# Patient Record
Sex: Female | Born: 1935 | Race: White | Hispanic: No
Health system: Southern US, Community
[De-identification: ages and names within clinical notes are randomized; demographics above are authoritative.]

## PROBLEM LIST (undated history)

## (undated) DIAGNOSIS — C801 Malignant (primary) neoplasm, unspecified: Secondary | ICD-10-CM

---

## 2001-08-29 ENCOUNTER — Encounter: Payer: Self-pay | Admitting: Obstetrics and Gynecology

## 2001-08-29 ENCOUNTER — Ambulatory Visit (HOSPITAL_COMMUNITY): Admission: RE | Admit: 2001-08-29 | Discharge: 2001-08-29 | Payer: Self-pay | Admitting: Obstetrics and Gynecology

## 2002-10-13 ENCOUNTER — Ambulatory Visit (HOSPITAL_COMMUNITY): Admission: RE | Admit: 2002-10-13 | Discharge: 2002-10-13 | Payer: Self-pay | Admitting: Internal Medicine

## 2002-10-13 ENCOUNTER — Encounter: Payer: Self-pay | Admitting: Internal Medicine

## 2003-11-13 ENCOUNTER — Ambulatory Visit: Admission: RE | Admit: 2003-11-13 | Discharge: 2004-02-11 | Payer: Self-pay | Admitting: *Deleted

## 2003-11-21 ENCOUNTER — Ambulatory Visit (HOSPITAL_COMMUNITY): Admission: RE | Admit: 2003-11-21 | Discharge: 2003-11-21 | Payer: Self-pay | Admitting: Internal Medicine

## 2005-10-12 ENCOUNTER — Ambulatory Visit (HOSPITAL_COMMUNITY): Admission: RE | Admit: 2005-10-12 | Discharge: 2005-10-12 | Payer: Self-pay | Admitting: Ophthalmology

## 2006-01-29 ENCOUNTER — Ambulatory Visit (HOSPITAL_COMMUNITY): Admission: RE | Admit: 2006-01-29 | Discharge: 2006-01-29 | Payer: Self-pay | Admitting: Ophthalmology

## 2008-10-12 ENCOUNTER — Emergency Department (HOSPITAL_COMMUNITY): Admission: EM | Admit: 2008-10-12 | Discharge: 2008-10-12 | Payer: Self-pay | Admitting: Emergency Medicine

## 2010-10-24 NOTE — Op Note (Signed)
NAME:  Daisy Ray, Daisy Ray                         ACCOUNT NO.:  [id number]   MEDICAL RECORD NO.:  [id number]                   PATIENT TYPE:  AMB   LOCATION:  DAY                                  FACILITY:  [facility]   PHYSICIAN:  Dalia Heading, M.D.               DATE OF BIRTH:  22-Jan-1929   DATE OF PROCEDURE:  11/21/2003  DATE OF DISCHARGE:                                 OPERATIVE REPORT   PREOPERATIVE DIAGNOSIS:  Necrosis of surgical wound, left breast.   POSTOPERATIVE DIAGNOSIS:  Necrosis of surgical wound, left breast.   PROCEDURE:  Debridement of left breast wound.   SURGEON:  Dr. Franky Macho   ANESTHESIA:  General.   INDICATIONS:  The patient is a 77 year old white female, status post a left  partial mastectomy with __________ lymph node biopsy at another facility in  May of this year, who presents with necrosis and dehiscence of her left  breast surgical site.  The patient now comes to the operating room for  debridement of the wound.  The risks and benefits of the procedure,  including bleeding, infection, and poor wound healing were fully explained  to the patient, gave informed consent.   PROCEDURE NOTE:  The patient was placed in the supine position.  After  general anesthesia was administered, the left breast was prepped and draped  using the usual sterile technique with Betadine.  Surgical site confirmation  was performed.   A necrotic incision line in the upper, outer quadrant of the left breast was  excised using an elliptical incision.  The dissection was taken down to the  breast tissue itself.  No abscess was found.  A small seroma was seen.  Clear yellow fluid was noted to be in this area.  The wound was irrigated  with normal saline.  The skin was then reapproximated using 3-0 nylon  interrupted sutures.  Betadine ointment and a dry sterile dressing were  applied.   All tape and needle counts were correct at the end of the procedure.  The  patient was  awakened and transferred to PACU in stable condition.   COMPLICATIONS:  None.   SPECIMENS:  Skin, left breast.   BLOOD LOSS:  Minimal.      ___________________________________________                                            Dalia Heading, M.D.   MAJ/MEDQ  D:  11/21/2003  T:  11/21/2003  Job:  [id number]   cc:   Erskine Speed, M.D.  [address], [place]  [place]  [place] [address]  Fax: [phone]   Elmer Sow. Dorna Bloom, M.D.  [address] - [facility]  [place]  [place] [address]  Fax: [phone]   Lynett Fish, M.D.  [phone]  Sissy [address] [place]  [place] [address]  Fax: [phone]   Almetta Lovely  [address]  [place]  [place] [address]  Fax: [phone]

## 2010-10-24 NOTE — H&P (Signed)
NAME:  Daisy Ray, Daisy Ray                         ACCOUNT NO.:  [id number]   MEDICAL RECORD NO.:  [id number]                   PATIENT TYPE:  AMB   LOCATION:  DAY                                  FACILITY:  [facility]   PHYSICIAN:  Dalia Heading, M.D.               DATE OF BIRTH:  1928-10-25   DATE OF ADMISSION:  DATE OF DISCHARGE:                                HISTORY & PHYSICAL   CHIEF COMPLAINT:  Poorly healing surgical wound, left breast.   HISTORY OF PRESENT ILLNESS:  The patient is a 77 year old white female who  was referred for evaluation and treatment of a poorly healing left breast  wound.  She underwent a left partial mastectomy with axillary lymph node  biopsy on Oct 16, 2003 by another physician. She states that she was cleared  of cancer and has seen Dr. Cleone Slim and Dr. Dorna Bloom of the cancer center in [place].  She has developed a wound infection which was initially treated with  Augmentin.  The wound has now become necrotic despite wound care at home.  She would like treatment here due to the proximity to work.   PAST MEDICAL HISTORY:  1. As noted above.  2. High cholesterol levels.   PAST SURGICAL HISTORY:  As noted above.   CURRENT MEDICATIONS:  1. Augmentin.  2. Arimidex.  3. Lipitor.   ALLERGIES:  No known drug allergies.   SOCIAL HISTORY:  The patient denies drinking or smoking.   REVIEW OF SYMPTOMS:  CARDIOPULMONARY:  She denies any cardiopulmonary  difficulties. HEMATOLOGIC:  She denies any bleeding disorders.   PHYSICAL EXAMINATION:  GENERAL:  The patient is a well developed, well  nourished white female who is anxious and in no acute distress.  VITAL SIGNS:  She is afebrile and her vital signs are stable.  LUNGS:  Clear to auscultation with equal breath sounds bilaterally.  HEART:  Regular rate and rhythm without S3, S4 or murmurs.  BREASTS:  The left breast examination reveals a surgical scar in the upper,  outer quadrant with necrotic edges and a partial  dehiscence of the skin.  Mild erythema is noted circumferentially.   IMPRESSION:  Poorly healing surgical wound, left breast.   PLAN:  The patient is scheduled for debridement of the left breast wound on  November 21, 2003.  The risks and benefits of the procedure including bleeding,  infection and a poorly healing wound were fully explained to the patient who  gave informed consent.     ___________________________________________                                         Dalia Heading, M.D.   MAJ/MEDQ  D:  11/20/2003  T:  11/20/2003  Job:  [id number]   cc:   Lynett Fish, M.D.  [address] [place]  [place] [address]  Fax: [phone]   Elmer Sow. Dorna Bloom, M.D.  [address] - [facility]  [place]  [place] [address]  Fax: [phone]   Erskine Speed, M.D.  [address], [place] [place]  [place]  [place] [address]  Fax: [phone]   Almetta Lovely  [address]  [place]  [place] [address]  Fax: [phone]

## 2010-11-07 ENCOUNTER — Other Ambulatory Visit (HOSPITAL_COMMUNITY): Payer: Self-pay | Admitting: Orthopaedic Surgery

## 2010-11-07 DIAGNOSIS — R52 Pain, unspecified: Secondary | ICD-10-CM

## 2010-11-10 ENCOUNTER — Ambulatory Visit (HOSPITAL_COMMUNITY)
Admission: RE | Admit: 2010-11-10 | Discharge: 2010-11-10 | Disposition: A | Discharge status: Home / Self Care | Payer: Medicare Other | Source: Ambulatory Visit | Attending: Orthopaedic Surgery | Admitting: Orthopaedic Surgery

## 2010-11-10 DIAGNOSIS — M1710 Unilateral primary osteoarthritis, unspecified knee: Secondary | ICD-10-CM | POA: Insufficient documentation

## 2010-11-10 DIAGNOSIS — IMO0002 Reserved for concepts with insufficient information to code with codable children: Secondary | ICD-10-CM | POA: Insufficient documentation

## 2010-11-10 DIAGNOSIS — M25569 Pain in unspecified knee: Secondary | ICD-10-CM | POA: Insufficient documentation

## 2010-11-10 DIAGNOSIS — R52 Pain, unspecified: Secondary | ICD-10-CM

## 2011-11-13 ENCOUNTER — Encounter: Payer: Medicare Other | Admitting: Hematology and Oncology

## 2011-11-13 ENCOUNTER — Other Ambulatory Visit: Payer: Self-pay | Admitting: Hematology and Oncology

## 2011-11-13 DIAGNOSIS — C50919 Malignant neoplasm of unspecified site of unspecified female breast: Secondary | ICD-10-CM

## 2011-11-13 DIAGNOSIS — C8589 Other specified types of non-Hodgkin lymphoma, extranodal and solid organ sites: Secondary | ICD-10-CM

## 2011-11-13 DIAGNOSIS — C8590 Non-Hodgkin lymphoma, unspecified, unspecified site: Secondary | ICD-10-CM

## 2011-11-19 ENCOUNTER — Encounter (HOSPITAL_COMMUNITY)
Admission: RE | Admit: 2011-11-19 | Discharge: 2011-11-19 | Disposition: A | Discharge status: Home / Self Care | Payer: Medicare Other | Source: Ambulatory Visit | Attending: Hematology and Oncology | Admitting: Hematology and Oncology

## 2011-11-19 DIAGNOSIS — C8589 Other specified types of non-Hodgkin lymphoma, extranodal and solid organ sites: Secondary | ICD-10-CM | POA: Insufficient documentation

## 2011-11-19 DIAGNOSIS — C50919 Malignant neoplasm of unspecified site of unspecified female breast: Secondary | ICD-10-CM | POA: Insufficient documentation

## 2011-11-19 DIAGNOSIS — I2510 Atherosclerotic heart disease of native coronary artery without angina pectoris: Secondary | ICD-10-CM | POA: Insufficient documentation

## 2011-11-19 DIAGNOSIS — C8590 Non-Hodgkin lymphoma, unspecified, unspecified site: Secondary | ICD-10-CM

## 2011-11-19 DIAGNOSIS — E049 Nontoxic goiter, unspecified: Secondary | ICD-10-CM | POA: Insufficient documentation

## 2011-11-19 LAB — GLUCOSE, CAPILLARY: Glucose-Capillary: 106 mg/dL — ABNORMAL HIGH (ref 70–99)

## 2011-11-19 MED ORDER — FLUDEOXYGLUCOSE F - 18 (FDG) INJECTION
16.0000 | Freq: Once | INTRAVENOUS | Status: AC | PRN
  Administered 2011-11-19: 16 via INTRAVENOUS

## 2011-11-25 ENCOUNTER — Encounter: Payer: Medicare Other | Admitting: Hematology and Oncology

## 2011-11-25 DIAGNOSIS — E785 Hyperlipidemia, unspecified: Secondary | ICD-10-CM

## 2011-11-25 DIAGNOSIS — C8299 Follicular lymphoma, unspecified, extranodal and solid organ sites: Secondary | ICD-10-CM

## 2011-11-25 DIAGNOSIS — C50919 Malignant neoplasm of unspecified site of unspecified female breast: Secondary | ICD-10-CM

## 2011-11-25 DIAGNOSIS — E039 Hypothyroidism, unspecified: Secondary | ICD-10-CM

## 2011-12-29 ENCOUNTER — Encounter: Payer: Medicare Other | Admitting: Hematology and Oncology

## 2011-12-29 DIAGNOSIS — C8589 Other specified types of non-Hodgkin lymphoma, extranodal and solid organ sites: Secondary | ICD-10-CM

## 2011-12-29 DIAGNOSIS — N3289 Other specified disorders of bladder: Secondary | ICD-10-CM

## 2011-12-29 DIAGNOSIS — C50919 Malignant neoplasm of unspecified site of unspecified female breast: Secondary | ICD-10-CM

## 2012-02-02 ENCOUNTER — Encounter: Payer: Medicare Other | Admitting: Hematology and Oncology

## 2012-02-02 DIAGNOSIS — C8589 Other specified types of non-Hodgkin lymphoma, extranodal and solid organ sites: Secondary | ICD-10-CM

## 2012-02-02 DIAGNOSIS — C50919 Malignant neoplasm of unspecified site of unspecified female breast: Secondary | ICD-10-CM

## 2012-02-29 ENCOUNTER — Encounter: Payer: Medicare Other | Admitting: Hematology and Oncology

## 2012-02-29 DIAGNOSIS — C50919 Malignant neoplasm of unspecified site of unspecified female breast: Secondary | ICD-10-CM

## 2012-02-29 DIAGNOSIS — C8291 Follicular lymphoma, unspecified, lymph nodes of head, face, and neck: Secondary | ICD-10-CM

## 2012-03-18 DIAGNOSIS — C8299 Follicular lymphoma, unspecified, extranodal and solid organ sites: Secondary | ICD-10-CM

## 2012-03-18 DIAGNOSIS — E039 Hypothyroidism, unspecified: Secondary | ICD-10-CM

## 2012-03-18 DIAGNOSIS — C50919 Malignant neoplasm of unspecified site of unspecified female breast: Secondary | ICD-10-CM

## 2012-03-18 DIAGNOSIS — N3289 Other specified disorders of bladder: Secondary | ICD-10-CM

## 2012-03-25 DIAGNOSIS — N8111 Cystocele, midline: Secondary | ICD-10-CM

## 2012-03-25 DIAGNOSIS — E785 Hyperlipidemia, unspecified: Secondary | ICD-10-CM

## 2012-03-25 DIAGNOSIS — C50919 Malignant neoplasm of unspecified site of unspecified female breast: Secondary | ICD-10-CM

## 2012-03-25 DIAGNOSIS — C8291 Follicular lymphoma, unspecified, lymph nodes of head, face, and neck: Secondary | ICD-10-CM

## 2012-05-17 ENCOUNTER — Encounter: Payer: Medicare Other | Admitting: Internal Medicine

## 2012-05-17 DIAGNOSIS — C8589 Other specified types of non-Hodgkin lymphoma, extranodal and solid organ sites: Secondary | ICD-10-CM

## 2012-05-17 DIAGNOSIS — R222 Localized swelling, mass and lump, trunk: Secondary | ICD-10-CM

## 2012-05-17 DIAGNOSIS — R05 Cough: Secondary | ICD-10-CM

## 2012-05-17 DIAGNOSIS — C50919 Malignant neoplasm of unspecified site of unspecified female breast: Secondary | ICD-10-CM

## 2012-05-26 DIAGNOSIS — C50919 Malignant neoplasm of unspecified site of unspecified female breast: Secondary | ICD-10-CM

## 2012-05-26 DIAGNOSIS — C8589 Other specified types of non-Hodgkin lymphoma, extranodal and solid organ sites: Secondary | ICD-10-CM

## 2012-05-27 ENCOUNTER — Other Ambulatory Visit: Payer: Self-pay | Admitting: Hematology and Oncology

## 2012-05-27 DIAGNOSIS — C8590 Non-Hodgkin lymphoma, unspecified, unspecified site: Secondary | ICD-10-CM

## 2012-06-07 ENCOUNTER — Other Ambulatory Visit: Payer: Self-pay | Admitting: Radiology

## 2012-06-07 DIAGNOSIS — K1230 Oral mucositis (ulcerative), unspecified: Secondary | ICD-10-CM

## 2012-06-07 DIAGNOSIS — K121 Other forms of stomatitis: Secondary | ICD-10-CM

## 2012-06-07 DIAGNOSIS — C8299 Follicular lymphoma, unspecified, extranodal and solid organ sites: Secondary | ICD-10-CM

## 2012-06-07 DIAGNOSIS — R609 Edema, unspecified: Secondary | ICD-10-CM

## 2012-06-10 ENCOUNTER — Ambulatory Visit (HOSPITAL_COMMUNITY)
Admission: RE | Admit: 2012-06-10 | Discharge: 2012-06-10 | Disposition: A | Discharge status: Home / Self Care | Payer: Medicare Other | Source: Ambulatory Visit | Attending: Hematology and Oncology | Admitting: Hematology and Oncology

## 2012-06-10 ENCOUNTER — Other Ambulatory Visit: Payer: Self-pay | Admitting: Hematology and Oncology

## 2012-06-10 DIAGNOSIS — C8590 Non-Hodgkin lymphoma, unspecified, unspecified site: Secondary | ICD-10-CM

## 2012-06-10 DIAGNOSIS — C8589 Other specified types of non-Hodgkin lymphoma, extranodal and solid organ sites: Secondary | ICD-10-CM | POA: Insufficient documentation

## 2012-06-10 LAB — CBC
Hemoglobin: 10.8 g/dL — ABNORMAL LOW (ref 12.0–15.0)
MCH: 28 pg (ref 26.0–34.0)
MCV: 82.9 fL (ref 78.0–100.0)
RBC: 3.86 MIL/uL — ABNORMAL LOW (ref 3.87–5.11)

## 2012-06-10 MED ORDER — HEPARIN SOD (PORK) LOCK FLUSH 100 UNIT/ML IV SOLN
500.0000 [IU] | Freq: Once | INTRAVENOUS | Status: AC
  Administered 2012-06-10: 500 [IU] via INTRAVENOUS

## 2012-06-10 MED ORDER — LIDOCAINE HCL 1 % IJ SOLN
INTRAMUSCULAR | Status: AC
  Filled 2012-06-10: qty 20

## 2012-06-10 MED ORDER — MIDAZOLAM HCL 2 MG/2ML IJ SOLN
INTRAMUSCULAR | Status: AC | PRN
  Administered 2012-06-10: 1 mg via INTRAVENOUS
  Administered 2012-06-10: 3 mg via INTRAVENOUS

## 2012-06-10 MED ORDER — CEFAZOLIN SODIUM 1-5 GM-% IV SOLN
1.0000 g | INTRAVENOUS | Status: AC
  Administered 2012-06-10: 1 g via INTRAVENOUS
  Filled 2012-06-10: qty 50

## 2012-06-10 MED ORDER — SODIUM CHLORIDE 0.9 % IV SOLN: INTRAVENOUS | Status: DC

## 2012-06-10 MED ORDER — FENTANYL CITRATE 0.05 MG/ML IJ SOLN
INTRAMUSCULAR | Status: AC
  Filled 2012-06-10: qty 4

## 2012-06-10 MED ORDER — FENTANYL CITRATE 0.05 MG/ML IJ SOLN
INTRAMUSCULAR | Status: AC | PRN
  Administered 2012-06-10: 100 ug via INTRAVENOUS

## 2012-06-10 MED ORDER — MIDAZOLAM HCL 2 MG/2ML IJ SOLN
INTRAMUSCULAR | Status: AC
  Filled 2012-06-10: qty 4

## 2012-06-10 NOTE — H&P (Signed)
Agree 

## 2012-06-10 NOTE — H&P (Signed)
Daisy Ray is an 79 y.o. female.   Chief Complaint: "I'm here for a port a cath" HPI: Patient with history of NHL presents today for port a cath placement for chemotherapy.  PMH: left breast cancer 2005, HTN, elevated cholesterol,  PSH: left partial mastectomy, bladder tack, hysterectomy FH: daughter with hx lymphoma                                                                                                          Social History:  does not have a smoking history on file. She does not have any smokeless tobacco history on file. Her alcohol and drug histories not on file.  Allergies: No Known Allergies  No current outpatient prescriptions on file. Current facility-administered medications:0.9 %  sodium chloride infusion, , Intravenous, Continuous, D Kevin Jeanet Lupe, PA;  ceFAZolin (ANCEF) IVPB 1 g/50 mL premix, 1 g, Intravenous, On Call, D Jeananne Rama, PA  Results for orders placed during the hospital encounter of 06/10/12  APTT      Component Value Range   aPTT 33  24 - 37 seconds  CBC      Component Value Range   WBC 7.8  4.0 - 10.5 K/uL   RBC 3.86 (*) 3.87 - 5.11 MIL/uL   Hemoglobin 10.8 (*) 12.0 - 15.0 g/dL   HCT 16.1 (*) 09.6 - 04.5 %   MCV 82.9  78.0 - 100.0 fL   MCH 28.0  26.0 - 34.0 pg   MCHC 33.8  30.0 - 36.0 g/dL   RDW 40.9  81.1 - 91.4 %   Platelets 235  150 - 400 K/uL  PROTIME-INR      Component Value Range   Prothrombin Time 13.2  11.6 - 15.2 seconds   INR 1.01  0.00 - 1.49    No results found.  Review of Systems  Constitutional: Negative for fever and chills.  Respiratory: Positive for shortness of breath. Negative for cough.   Cardiovascular: Negative for chest pain.  Gastrointestinal: Negative for nausea, vomiting and abdominal pain.  Musculoskeletal: Negative for back pain.       Bilateral leg swelling  Neurological: Negative for headaches.  Endo/Heme/Allergies: Does not bruise/bleed easily.    Blood pressure 135/55, pulse 68, temperature 97.6 F  (36.4 C), temperature source Oral, resp. rate 20, SpO2 95.00%. Physical Exam  Constitutional: She is oriented to person, place, and time. She appears well-developed and well-nourished.  Cardiovascular: Normal rate and regular rhythm.   Respiratory: Effort normal.       Few left basilar crackles  GI: Soft. Bowel sounds are normal. There is no tenderness.  Musculoskeletal: Normal range of motion. She exhibits edema.  Neurological: She is alert and oriented to person, place, and time.     Assessment/Plan Pt with NHL. Plan is for port a cath placement today for chemotherapy. Details/risks of procedure d/w pt/family with their understanding and consent.  Breven Guidroz,D KEVIN 06/10/2012, 12:57 PM

## 2012-06-10 NOTE — Procedures (Signed)
Procedure:  Right IJ portacath Access:  Right IJ vein Findings:  Cath tip at cavoatrial junction.  OK to use.

## 2012-06-13 DIAGNOSIS — C8589 Other specified types of non-Hodgkin lymphoma, extranodal and solid organ sites: Secondary | ICD-10-CM

## 2012-06-13 DIAGNOSIS — C50919 Malignant neoplasm of unspecified site of unspecified female breast: Secondary | ICD-10-CM

## 2012-06-13 DIAGNOSIS — Z5111 Encounter for antineoplastic chemotherapy: Secondary | ICD-10-CM

## 2012-06-14 DIAGNOSIS — C8589 Other specified types of non-Hodgkin lymphoma, extranodal and solid organ sites: Secondary | ICD-10-CM

## 2012-06-14 DIAGNOSIS — Z5111 Encounter for antineoplastic chemotherapy: Secondary | ICD-10-CM

## 2012-06-14 DIAGNOSIS — Z5112 Encounter for antineoplastic immunotherapy: Secondary | ICD-10-CM

## 2012-06-15 DIAGNOSIS — T451X5A Adverse effect of antineoplastic and immunosuppressive drugs, initial encounter: Secondary | ICD-10-CM

## 2012-06-15 DIAGNOSIS — C8589 Other specified types of non-Hodgkin lymphoma, extranodal and solid organ sites: Secondary | ICD-10-CM

## 2012-06-15 DIAGNOSIS — D702 Other drug-induced agranulocytosis: Secondary | ICD-10-CM

## 2012-06-21 DIAGNOSIS — C8299 Follicular lymphoma, unspecified, extranodal and solid organ sites: Secondary | ICD-10-CM

## 2012-06-21 DIAGNOSIS — D72829 Elevated white blood cell count, unspecified: Secondary | ICD-10-CM

## 2012-07-11 DIAGNOSIS — C8589 Other specified types of non-Hodgkin lymphoma, extranodal and solid organ sites: Secondary | ICD-10-CM

## 2012-07-11 DIAGNOSIS — Z5111 Encounter for antineoplastic chemotherapy: Secondary | ICD-10-CM

## 2012-07-11 DIAGNOSIS — Z5112 Encounter for antineoplastic immunotherapy: Secondary | ICD-10-CM

## 2012-07-12 DIAGNOSIS — Z5111 Encounter for antineoplastic chemotherapy: Secondary | ICD-10-CM

## 2012-07-12 DIAGNOSIS — C8589 Other specified types of non-Hodgkin lymphoma, extranodal and solid organ sites: Secondary | ICD-10-CM

## 2012-07-13 DIAGNOSIS — D702 Other drug-induced agranulocytosis: Secondary | ICD-10-CM

## 2012-07-13 DIAGNOSIS — T451X5A Adverse effect of antineoplastic and immunosuppressive drugs, initial encounter: Secondary | ICD-10-CM

## 2012-07-13 DIAGNOSIS — C8589 Other specified types of non-Hodgkin lymphoma, extranodal and solid organ sites: Secondary | ICD-10-CM

## 2012-08-08 DIAGNOSIS — D649 Anemia, unspecified: Secondary | ICD-10-CM

## 2012-08-08 DIAGNOSIS — C8589 Other specified types of non-Hodgkin lymphoma, extranodal and solid organ sites: Secondary | ICD-10-CM

## 2012-08-08 DIAGNOSIS — Z5111 Encounter for antineoplastic chemotherapy: Secondary | ICD-10-CM

## 2012-08-09 DIAGNOSIS — C8299 Follicular lymphoma, unspecified, extranodal and solid organ sites: Secondary | ICD-10-CM

## 2012-08-09 DIAGNOSIS — Z5111 Encounter for antineoplastic chemotherapy: Secondary | ICD-10-CM

## 2012-08-09 DIAGNOSIS — C50919 Malignant neoplasm of unspecified site of unspecified female breast: Secondary | ICD-10-CM

## 2012-08-10 DIAGNOSIS — C50919 Malignant neoplasm of unspecified site of unspecified female breast: Secondary | ICD-10-CM

## 2012-08-10 DIAGNOSIS — D702 Other drug-induced agranulocytosis: Secondary | ICD-10-CM

## 2012-08-10 DIAGNOSIS — C8299 Follicular lymphoma, unspecified, extranodal and solid organ sites: Secondary | ICD-10-CM

## 2012-08-15 DIAGNOSIS — C8292 Follicular lymphoma, unspecified, intrathoracic lymph nodes: Secondary | ICD-10-CM

## 2012-09-02 DIAGNOSIS — C8589 Other specified types of non-Hodgkin lymphoma, extranodal and solid organ sites: Secondary | ICD-10-CM

## 2012-09-13 DIAGNOSIS — C8589 Other specified types of non-Hodgkin lymphoma, extranodal and solid organ sites: Secondary | ICD-10-CM

## 2012-10-28 DIAGNOSIS — C8589 Other specified types of non-Hodgkin lymphoma, extranodal and solid organ sites: Secondary | ICD-10-CM

## 2012-10-28 DIAGNOSIS — R918 Other nonspecific abnormal finding of lung field: Secondary | ICD-10-CM

## 2012-11-24 DIAGNOSIS — C8589 Other specified types of non-Hodgkin lymphoma, extranodal and solid organ sites: Secondary | ICD-10-CM

## 2012-11-24 DIAGNOSIS — E785 Hyperlipidemia, unspecified: Secondary | ICD-10-CM

## 2012-11-24 DIAGNOSIS — E0590 Thyrotoxicosis, unspecified without thyrotoxic crisis or storm: Secondary | ICD-10-CM

## 2012-12-22 DIAGNOSIS — R918 Other nonspecific abnormal finding of lung field: Secondary | ICD-10-CM

## 2012-12-22 DIAGNOSIS — C8299 Follicular lymphoma, unspecified, extranodal and solid organ sites: Secondary | ICD-10-CM

## 2013-02-02 DIAGNOSIS — C8589 Other specified types of non-Hodgkin lymphoma, extranodal and solid organ sites: Secondary | ICD-10-CM

## 2013-02-02 DIAGNOSIS — Z452 Encounter for adjustment and management of vascular access device: Secondary | ICD-10-CM

## 2013-02-02 DIAGNOSIS — C50919 Malignant neoplasm of unspecified site of unspecified female breast: Secondary | ICD-10-CM

## 2013-03-21 DIAGNOSIS — C50919 Malignant neoplasm of unspecified site of unspecified female breast: Secondary | ICD-10-CM

## 2013-03-21 DIAGNOSIS — C8589 Other specified types of non-Hodgkin lymphoma, extranodal and solid organ sites: Secondary | ICD-10-CM

## 2013-03-21 DIAGNOSIS — Z452 Encounter for adjustment and management of vascular access device: Secondary | ICD-10-CM

## 2013-04-04 ENCOUNTER — Other Ambulatory Visit (HOSPITAL_COMMUNITY): Payer: Self-pay | Admitting: Hematology and Oncology

## 2013-04-04 DIAGNOSIS — R911 Solitary pulmonary nodule: Secondary | ICD-10-CM

## 2013-04-04 DIAGNOSIS — C8589 Other specified types of non-Hodgkin lymphoma, extranodal and solid organ sites: Secondary | ICD-10-CM

## 2013-04-04 DIAGNOSIS — C8590 Non-Hodgkin lymphoma, unspecified, unspecified site: Secondary | ICD-10-CM

## 2013-04-13 ENCOUNTER — Encounter (HOSPITAL_COMMUNITY)
Admission: RE | Admit: 2013-04-13 | Discharge: 2013-04-13 | Disposition: A | Discharge status: Home / Self Care | Payer: Medicare Other | Source: Ambulatory Visit | Attending: Hematology and Oncology | Admitting: Hematology and Oncology

## 2013-04-13 DIAGNOSIS — C8589 Other specified types of non-Hodgkin lymphoma, extranodal and solid organ sites: Secondary | ICD-10-CM | POA: Insufficient documentation

## 2013-04-13 DIAGNOSIS — R911 Solitary pulmonary nodule: Secondary | ICD-10-CM

## 2013-04-13 DIAGNOSIS — C8590 Non-Hodgkin lymphoma, unspecified, unspecified site: Secondary | ICD-10-CM

## 2013-04-13 MED ORDER — FLUDEOXYGLUCOSE F - 18 (FDG) INJECTION
17.0000 | Freq: Once | INTRAVENOUS | Status: AC | PRN
  Administered 2013-04-13: 17 via INTRAVENOUS

## 2013-04-17 DIAGNOSIS — R911 Solitary pulmonary nodule: Secondary | ICD-10-CM

## 2013-04-17 DIAGNOSIS — Z853 Personal history of malignant neoplasm of breast: Secondary | ICD-10-CM

## 2013-04-17 DIAGNOSIS — C8589 Other specified types of non-Hodgkin lymphoma, extranodal and solid organ sites: Secondary | ICD-10-CM

## 2013-05-16 DIAGNOSIS — C50919 Malignant neoplasm of unspecified site of unspecified female breast: Secondary | ICD-10-CM

## 2013-05-16 DIAGNOSIS — Z452 Encounter for adjustment and management of vascular access device: Secondary | ICD-10-CM

## 2013-05-16 DIAGNOSIS — C8589 Other specified types of non-Hodgkin lymphoma, extranodal and solid organ sites: Secondary | ICD-10-CM

## 2014-04-02 ENCOUNTER — Other Ambulatory Visit (INDEPENDENT_AMBULATORY_CARE_PROVIDER_SITE_OTHER): Payer: Self-pay | Admitting: *Deleted

## 2014-04-02 ENCOUNTER — Telehealth (INDEPENDENT_AMBULATORY_CARE_PROVIDER_SITE_OTHER): Payer: Self-pay | Admitting: *Deleted

## 2014-04-02 ENCOUNTER — Encounter (HOSPITAL_COMMUNITY): Payer: Self-pay | Admitting: Pharmacy Technician

## 2014-04-02 DIAGNOSIS — R935 Abnormal findings on diagnostic imaging of other abdominal regions, including retroperitoneum: Secondary | ICD-10-CM

## 2014-04-02 NOTE — Telephone Encounter (Signed)
Referring MD/PCP: neijstrom   Procedure: egd  Reason/Indication:  Abnormal CT  Has patient had this procedure before?  no  If so, when, by whom and where?    Is there a family history of colon cancer?    Who?  What age when diagnosed?    Is patient diabetic?   no      Does patient have prosthetic heart valve?  no  Do you have a pacemaker?  no  Has patient ever had endocarditis? no  Has patient had joint replacement within last 12 months?  no  Does patient tend to be constipated or take laxatives? no  Is patient on Coumadin, Plavix and/or Aspirin? no  Medications: see epic  Allergies: nkda  Medication Adjustment:   Procedure date & time: 04/06/14 at 215

## 2014-04-05 NOTE — Telephone Encounter (Signed)
agree

## 2014-04-06 ENCOUNTER — Encounter (HOSPITAL_COMMUNITY): Payer: Self-pay | Admitting: *Deleted

## 2014-04-06 ENCOUNTER — Encounter (HOSPITAL_COMMUNITY)
Admission: RE | Disposition: A | Discharge status: Home / Self Care | Payer: Self-pay | Source: Ambulatory Visit | Attending: Internal Medicine

## 2014-04-06 ENCOUNTER — Ambulatory Visit (HOSPITAL_COMMUNITY)
Admission: RE | Admit: 2014-04-06 | Discharge: 2014-04-06 | Disposition: A | Discharge status: Home / Self Care | Payer: Medicare Other | Source: Ambulatory Visit | Attending: Internal Medicine | Admitting: Internal Medicine

## 2014-04-06 DIAGNOSIS — R1909 Other intra-abdominal and pelvic swelling, mass and lump: Secondary | ICD-10-CM | POA: Diagnosis present

## 2014-04-06 DIAGNOSIS — Z791 Long term (current) use of non-steroidal anti-inflammatories (NSAID): Secondary | ICD-10-CM

## 2014-04-06 DIAGNOSIS — Z8572 Personal history of non-Hodgkin lymphomas: Secondary | ICD-10-CM | POA: Insufficient documentation

## 2014-04-06 DIAGNOSIS — R933 Abnormal findings on diagnostic imaging of other parts of digestive tract: Secondary | ICD-10-CM

## 2014-04-06 DIAGNOSIS — K449 Diaphragmatic hernia without obstruction or gangrene: Secondary | ICD-10-CM | POA: Diagnosis not present

## 2014-04-06 DIAGNOSIS — Q2733 Arteriovenous malformation of digestive system vessel: Secondary | ICD-10-CM | POA: Diagnosis not present

## 2014-04-06 DIAGNOSIS — C8590 Non-Hodgkin lymphoma, unspecified, unspecified site: Secondary | ICD-10-CM

## 2014-04-06 DIAGNOSIS — K2900 Acute gastritis without bleeding: Secondary | ICD-10-CM | POA: Insufficient documentation

## 2014-04-06 DIAGNOSIS — R935 Abnormal findings on diagnostic imaging of other abdominal regions, including retroperitoneum: Secondary | ICD-10-CM

## 2014-04-06 DIAGNOSIS — Z79899 Other long term (current) drug therapy: Secondary | ICD-10-CM | POA: Insufficient documentation

## 2014-04-06 HISTORY — DX: Malignant (primary) neoplasm, unspecified: C80.1

## 2014-04-06 HISTORY — PX: ESOPHAGOGASTRODUODENOSCOPY: SHX5428

## 2014-04-06 SURGERY — EGD (ESOPHAGOGASTRODUODENOSCOPY)
Anesthesia: Moderate Sedation

## 2014-04-06 MED ORDER — MEPERIDINE HCL 50 MG/ML IJ SOLN
INTRAMUSCULAR | Status: DC | PRN
  Administered 2014-04-06: 25 mg via INTRAVENOUS

## 2014-04-06 MED ORDER — STERILE WATER FOR IRRIGATION IR SOLN
Status: DC | PRN
  Administered 2014-04-06: 15:00:00

## 2014-04-06 MED ORDER — SODIUM CHLORIDE 0.9 % IV SOLN
INTRAVENOUS | Status: DC
  Administered 2014-04-06: 15:00:00 via INTRAVENOUS

## 2014-04-06 MED ORDER — MIDAZOLAM HCL 5 MG/5ML IJ SOLN
INTRAMUSCULAR | Status: DC | PRN
  Administered 2014-04-06: 2 mg via INTRAVENOUS
  Administered 2014-04-06: 1 mg via INTRAVENOUS

## 2014-04-06 MED ORDER — MEPERIDINE HCL 50 MG/ML IJ SOLN
INTRAMUSCULAR | Status: AC
  Filled 2014-04-06: qty 1

## 2014-04-06 MED ORDER — BUTAMBEN-TETRACAINE-BENZOCAINE 2-2-14 % EX AERO
INHALATION_SPRAY | CUTANEOUS | Status: DC | PRN
  Administered 2014-04-06: 2 via TOPICAL

## 2014-04-06 MED ORDER — PANTOPRAZOLE SODIUM 40 MG PO TBEC
40.0000 mg | DELAYED_RELEASE_TABLET | Freq: Every day | ORAL | Status: AC
Start: 2014-04-06 — End: ?

## 2014-04-06 MED ORDER — MIDAZOLAM HCL 5 MG/5ML IJ SOLN
INTRAMUSCULAR | Status: AC
  Filled 2014-04-06: qty 10

## 2014-04-06 NOTE — OR Nursing (Signed)
Dr. Laural Golden notified of patient complaining of shortness of breath. O2 saturation 90-91% on room air. Patient states, "I just feel like I can't catch my breath. I think it's fluid in there." Dr. Laural Golden will evaluate patient prior to procedure.

## 2014-04-06 NOTE — Op Note (Signed)
EGD PROCEDURE REPORT  PATIENT:  Daisy Ray  MR#:  [id number] Birthdate:  Mar 23, 1929, 80 y.o., female Endoscopist:  Dr. Rogene Houston, MD Referred By:  Dr. Everardo All, MD Procedure Date: 04/06/2014  Procedure:   EGD  Indications:  Patient is a 80 year old Caucasian female with history of low-grade non-Hodgkin's lymphoma who underwent chest and abdominopelvic CT recently and found to have 4 cm soft tissue density gastric fundus. She has been using OTC NSAIDs for left-sided chest pain which started two weeks ago and she moving furniture. She denies nausea vomiting abdominal pain melena or rectal bleeding but does have intermittent heartburn.            Informed Consent:  The risks, benefits, alternatives & imponderables which include, but are not limited to, bleeding, infection, perforation, drug reaction and potential missed lesion have been reviewed.  The potential for biopsy, lesion removal, esophageal dilation, etc. have also been discussed.  Questions have been answered.  All parties agreeable.  Please see history & physical in medical record for more information.  Medications:  Demerol 25 mg IV Versed 3 mg IV Cetacaine spray topically for oropharyngeal anesthesia  Description of procedure:  The endoscope was introduced through the mouth and advanced to the second portion of the duodenum without difficulty or limitations. The mucosal surfaces were surveyed very carefully during advancement of the scope and upon withdrawal.  Findings:  Esophagus:  Mucosa of the esophagus was normal. Linear/circumferential edema and erythema noted at GE junction without ring or stricture formation. GEJ:  37 cm Hiatus:  40 cm Stomach:  Stomach was empty and distended very well with insufflation. Folds in the proximal stomach were unremarkable. Patchy erythema and erosions noted at gastric body primarily along the posterior wall. Antral mucosa was spared. Pyloric channel was patent. Angularis was  unremarkable. Fundus and cardia were thoroughly examined and there was no mass. Duodenum:  Bulbar mucosa was normal. 2 small nonbleeding AV malformations noted involving second part of the duodenum and no therapy was rendered.  Therapeutic/Diagnostic Maneuvers Performed:  None  Complications:  None  Impression: Small sliding hiatal hernia along with changes of reflux esophagitis limited to GE junction. Erosive gastritis primarily involving gastric body most likely secondary to NSAID use. Will rule out H pylori infection. Two small nonbleeding duodenal AV malformations which were left alone.  Comment; CT abnormality involving gastric fundus secondary to hiatal hernia.  Recommendations:  H. pylori serology. Pantoprazole 40 mg by mouth every morning. Patient advised to take Advil 20 mg up to 3 times a day as needed and discontinue Naprosyn. Follow with Dr. Tressie Stalker as planned.  Daisy Ray  04/06/2014  3:49 PM  CC: Dr. Glenda Chroman., MD & Dr. Rayne Du ref. provider found CC: Dr. Everardo All, MD

## 2014-04-06 NOTE — Discharge Instructions (Signed)
Resume usual medications but do not take Aleve or Naprosyn while on Advil or ibuprofen. Resume usual diet. Pantoprazole 40 mg by mouth every morning. No driving for 24 hours. Physician will call with results of blood test.  Esophagogastroduodenoscopy Care After Refer to this sheet in the next few weeks. These instructions provide you with information on caring for yourself after your procedure. Your caregiver may also give you more specific instructions. Your treatment has been planned according to current medical practices, but problems sometimes occur. Call your caregiver if you have any problems or questions after your procedure.  HOME CARE INSTRUCTIONS  Do not eat or drink anything until the numbing medicine (local anesthetic) has worn off and your gag reflex has returned. You will know that the local anesthetic has worn off when you can swallow comfortably.  Do not drive for 12 hours after the procedure or as directed by your caregiver.  Only take medicines as directed by your caregiver. SEEK MEDICAL CARE IF:   You cannot stop coughing.  You are not urinating at all or less than usual. SEEK IMMEDIATE MEDICAL CARE IF:  You have difficulty swallowing.  You cannot eat or drink.  You have worsening throat or chest pain.  You have dizziness, lightheadedness, or you faint.  You have nausea or vomiting.  You have chills.  You have a fever.  You have severe abdominal pain.  You have Eckerson, tarry, or bloody stools. Document Released: 05/11/2012 Document Reviewed: 05/11/2012 [facility] Patient Information 2015 [place]. This information is not intended to replace advice given to you by your health care provider. Make sure you discuss any questions you have with your health care provider.

## 2014-04-06 NOTE — H&P (Signed)
Daisy Ray is an 80 y.o. female.   Chief Complaint: Patient is here for EGD. HPI: Patient is a 80-year-old Caucasian female who has history of low-grade non-Hodgkin's lymphoma and is being followed by Dr.Neijstrom. About 2 weeks ago she developed left-sided chest pain while moving furniture. She was evaluated with chest and abdominopelvic CT. She was noted to have pulmonary nodules left sided scarring and some volume loss. Abdominal CT revealed a 4 cm mass in the fundal region of stomach. She denies nausea vomiting abdominal pain melena or rectal bleeding anorexia or weight loss. She has been using OTC NSAIDs and her chest pain has resolved.  Past Medical History  Diagnosis Date  . Cancer     Non-Hodgkin's Lymphoma    No past surgical history on file.  No family history on file. Social History:  has no tobacco, alcohol, and drug history on file.  Allergies: No Known Allergies  Medications Prior to Admission  Medication Sig Dispense Refill  . acetaminophen (TYLENOL) 500 MG tablet Take 1,000 mg by mouth every 6 (six) hours as needed for moderate pain.      Marland Kitchen ALPRAZolam (XANAX XR) 1 MG 24 hr tablet Take 0.5-1 mg by mouth at bedtime as needed. For sleep/anxiety.      Marland Kitchen atorvastatin (LIPITOR) 10 MG tablet Take 10 mg by mouth daily.      . citalopram (CELEXA) 20 MG tablet Take 20 mg by mouth daily.      . DULoxetine (CYMBALTA) 60 MG capsule Take 60 mg by mouth daily.      . fexofenadine (ALLEGRA) 180 MG tablet Take 180 mg by mouth daily.      Marland Kitchen ibuprofen (ADVIL,MOTRIN) 200 MG tablet Take 200 mg by mouth every 8 (eight) hours as needed. For pain.      Marland Kitchen levothyroxine (SYNTHROID, LEVOTHROID) 137 MCG tablet Take 137 mcg by mouth daily before breakfast.      . naproxen sodium (ANAPROX) 220 MG tablet Take 440 mg by mouth 2 (two) times daily as needed. For pain.      Marland Kitchen propranolol (INDERAL) 80 MG tablet Take 80 mg by mouth daily.      . furosemide (LASIX) 20 MG tablet Take 20 mg by mouth daily  as needed. For fluid.        No results found for this or any previous visit (from the past 48 hour(s)). No results found.  ROS  Blood pressure 137/65, pulse 82, temperature 97.4 F (36.3 C), temperature source Oral, resp. rate 18, height 5\' 4"  (1.626 m), weight 169 lb (76.658 kg), SpO2 91.00%. Physical Exam  Constitutional: She appears well-developed and well-nourished.  HENT:  Mouth/Throat: Oropharynx is clear and moist.  Eyes: Conjunctivae are normal. No scleral icterus.  Neck: No thyromegaly present.  Cardiovascular: Normal rate, regular rhythm and normal heart sounds.   No murmur heard. Respiratory: Effort normal.  Diminished breath sounds left base  GI: Soft. She exhibits no distension and no mass. There is no tenderness.  Musculoskeletal: She exhibits no edema.  Lymphadenopathy:    She has no cervical adenopathy.  Neurological: She is alert.  Skin: Skin is warm and dry.     Assessment/Plan Abnormal CT revealing 4 cm mass at gastric fundus. History of non-Hodgkin's lymphoma. Diagnostic EGD.  REHMAN,NAJEEB U 04/06/2014, 3:26 PM

## 2014-04-09 ENCOUNTER — Encounter (HOSPITAL_COMMUNITY): Payer: Self-pay | Admitting: Internal Medicine

## 2014-04-09 LAB — H. PYLORI ANTIBODY, IGG: H Pylori IgG: <0.4 {ISR}

## 2014-04-11 ENCOUNTER — Encounter (INDEPENDENT_AMBULATORY_CARE_PROVIDER_SITE_OTHER): Payer: Self-pay | Admitting: *Deleted

## 2014-04-16 ENCOUNTER — Telehealth (INDEPENDENT_AMBULATORY_CARE_PROVIDER_SITE_OTHER): Payer: Self-pay | Admitting: *Deleted

## 2014-04-16 NOTE — Telephone Encounter (Signed)
Patient called and I returned her call. I gave her the result of the H-Pylori,negative. Patient was short of breathe and crying. Stated that she had gone to Ed at [facility] twice this past week for the shortness of breathe and they could not do anything for her. She also states that she has an appointment with Dr.Neijstrom tomorrow 04/17/14. However patient states that her daughter , grandchild , and a neighbor are with her. The neighbor is going to take her to the ED at [facility].

## 2014-05-08 DEATH — deceased

## 2015-04-25 IMAGING — CT NM PET TUM IMG RESTAG (PS) SKULL BASE T - THIGH
1 of 5 series · 1 of 25 positions shown · IV contrast ([ID])
Comparison: PET-CT 11/19/2011. Recent diagnostic chest CT
03/20/2013, abdomen/pelvis CT 03/28/2013.

CLINICAL DATA: Subsequent treatment strategy for non-Hodgkin's
lymphoma. Previously FDG avid disease in the left neck and left
axilla.

EXAM:
NUCLEAR MEDICINE PET SKULL BASE TO THIGH
FASTING BLOOD GLUCOSE:  Value: 102mg/dl
TECHNIQUE: 17 mCi F-18 FDG was injected intravenously. CT data was obtained and
used for attenuation correction and anatomic localization only.
(This was not acquired as a diagnostic CT examination.) Additional
exam technical data entered on technologist worksheet.

[Series 1: pet ac · axial · 3.3mm · 4.69mm/px · 1 of 267 slices shown]
[im 153/267]
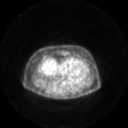

[1 of 25 positions shown; findings below may reference images not displayed]

FINDINGS: CT findings are as per recent diagnostic exams listed above.

NECK

Symmetric non masslike vocal cord uptake likely reflects phonation.
Mild diffuse right greater left thyroid lobe uptake is reidentified.
No abnormal uptake is now identified within the neck otherwise.

CHEST

No hypermetabolic mediastinal or hilar nodes. No suspicious
pulmonary nodules on the CT scan.

ABDOMEN/PELVIS

No abnormal hypermetabolic activity within the liver, pancreas,
adrenal glands, or spleen. No hypermetabolic lymph nodes in the
abdomen or pelvis.

SKELETON

No focal hypermetabolic activity to suggest skeletal metastasis.
IMPRESSION: Interval resolution of previously seen left neck and axilla uptake.
No suspicious area of abnormal uptake of FDG in the neck, chest,
abdomen, or pelvis. Findings are compatible with response to
treatment.
